# Patient Record
Sex: Male | Born: 1975 | Hispanic: No | Marital: Single
Health system: Southern US, Community
[De-identification: ages and names within clinical notes are randomized; demographics above are authoritative.]

---

## 2001-09-22 ENCOUNTER — Encounter: Payer: Self-pay | Admitting: *Deleted

## 2001-09-22 ENCOUNTER — Emergency Department (HOSPITAL_COMMUNITY): Admission: EM | Admit: 2001-09-22 | Discharge: 2001-09-22 | Payer: Self-pay | Admitting: *Deleted

## 2014-07-14 ENCOUNTER — Encounter (HOSPITAL_COMMUNITY): Payer: Self-pay | Admitting: Emergency Medicine

## 2014-07-14 ENCOUNTER — Emergency Department (HOSPITAL_COMMUNITY)
Admission: EM | Admit: 2014-07-14 | Discharge: 2014-07-14 | Disposition: A | Discharge status: Home / Self Care | Payer: Self-pay | Attending: Emergency Medicine | Admitting: Emergency Medicine

## 2014-07-14 ENCOUNTER — Emergency Department (HOSPITAL_COMMUNITY): Payer: Self-pay

## 2014-07-14 DIAGNOSIS — R079 Chest pain, unspecified: Secondary | ICD-10-CM

## 2014-07-14 DIAGNOSIS — M546 Pain in thoracic spine: Secondary | ICD-10-CM | POA: Insufficient documentation

## 2014-07-14 DIAGNOSIS — R0602 Shortness of breath: Secondary | ICD-10-CM | POA: Insufficient documentation

## 2014-07-14 DIAGNOSIS — R0789 Other chest pain: Secondary | ICD-10-CM | POA: Insufficient documentation

## 2014-07-14 LAB — BASIC METABOLIC PANEL
Anion gap: 13 (ref 5–15)
BUN: 14 mg/dL (ref 6–23)
CALCIUM: 9.1 mg/dL (ref 8.4–10.5)
CO2: 21 mEq/L (ref 19–32)
CREATININE: 0.76 mg/dL (ref 0.50–1.35)
Chloride: 105 mEq/L (ref 96–112)
GFR calc Af Amer: >90 mL/min (ref >90)
GFR calc non Af Amer: >90 mL/min (ref >90)
GLUCOSE: 94 mg/dL (ref 70–99)
Potassium: 3.8 mEq/L (ref 3.7–5.3)
Sodium: 139 mEq/L (ref 137–147)

## 2014-07-14 LAB — CBC
HCT: 43.8 % (ref 39.0–52.0)
Hemoglobin: 15.5 g/dL (ref 13.0–17.0)
MCH: 30.3 pg (ref 26.0–34.0)
MCHC: 35.4 g/dL (ref 30.0–36.0)
MCV: 85.7 fL (ref 78.0–100.0)
Platelets: 141 10*3/uL — ABNORMAL LOW (ref 150–400)
RBC: 5.11 MIL/uL (ref 4.22–5.81)
RDW: 12.5 % (ref 11.5–15.5)
WBC: 5.8 10*3/uL (ref 4.0–10.5)

## 2014-07-14 LAB — I-STAT TROPONIN, ED: Troponin i, poc: 0 ng/mL (ref 0.00–0.08)

## 2014-07-14 MED ORDER — ALBUTEROL SULFATE HFA 108 (90 BASE) MCG/ACT IN AERS
2.0000 | INHALATION_SPRAY | RESPIRATORY_TRACT | Status: AC
  Administered 2014-07-14: 2 via RESPIRATORY_TRACT
  Filled 2014-07-14: qty 6.7

## 2014-07-14 MED ORDER — HYDROCODONE-ACETAMINOPHEN 5-325 MG PO TABS: 2.0000 | ORAL_TABLET | Freq: Once | ORAL | Status: DC

## 2014-07-14 MED ORDER — IBUPROFEN 800 MG PO TABS
800.0000 mg | ORAL_TABLET | Freq: Once | ORAL | Status: AC
  Administered 2014-07-14: 800 mg via ORAL
  Filled 2014-07-14: qty 1

## 2014-07-14 MED ORDER — CYCLOBENZAPRINE HCL 10 MG PO TABS: 10.0000 mg | ORAL_TABLET | Freq: Two times a day (BID) | ORAL | Status: AC | PRN

## 2014-07-14 MED ORDER — ALBUTEROL SULFATE HFA 108 (90 BASE) MCG/ACT IN AERS: 2.0000 | INHALATION_SPRAY | RESPIRATORY_TRACT | Status: AC | PRN

## 2014-07-14 NOTE — ED Notes (Signed)
Per patient (via interpreter): Pain in lungs for 2 weeks, rated 2/10 at worse and right now.  Started off slowly and becoming more constant as time goes on.  Bad enough to come to ED 2 days ago, was seen at other clinic 2 days ago and nothing was done.  Denies cough and fever, no vomiting, no abdominal pain, has had mild headache since 2 weeks ago but only once.   Shortness of breath comes and goes, but pain is constant.  Patient gets sob while working or exerting.  No swelling in legs or long travel recently.   Also has chest pain he states that feels like something is poking.  Rated 2/10, mid left chest, constant, reproducible.  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  Has no previous medical problems except for high cholesterol, does not take any meds on a daily basis and has no allergies.

## 2014-07-14 NOTE — Discharge Instructions (Signed)
Please follow th directions provided.  Be sure to establish care with a primary care provider.  You may use the muscle relaxer medicines for the back pain and the inhaler to help with your shortness of breath.  Don't hesitate to return for new, worsening or concerning symptoms.    SEEK IMMEDIATE MEDICAL CARE IF:  You have pain that radiates from your back into your legs.  You develop new bowel or bladder control problems.  You have unusual weakness or numbness in your arms or legs.  You develop nausea or vomiting.  You develop abdominal pain.  You feel faint. Your shortness of breath gets worse.  You feel light-headed, faint, or develop a cough not controlled with medicines.  You start coughing up blood.  You have pain with breathing.  You have chest pain or pain in your arms, shoulders, or abdomen.  You have a fever.  You are unable to walk up stairs or exercise the way you normally do.   Emergency Department Resource Guide 1) Find a Doctor and Pay Out of Pocket Although you won't have to find out who is covered by your insurance plan, it is a good idea to ask around and get recommendations. You will then need to call the office and see if the doctor you have chosen will accept you as a new patient and what types of options they offer for patients who are self-pay. Some doctors offer discounts or will set up payment plans for their patients who do not have insurance, but you will need to ask so you aren't surprised when you get to your appointment.  2) Contact Your Local Health Department Not all health departments have doctors that can see patients for sick visits, but many do, so it is worth a call to see if yours does. If you don't know where your local health department is, you can check in your phone book. The CDC also has a tool to help you locate your state's health department, and many state websites also have listings of all of their local health departments.  3) Find a Walk-in  Clinic If your illness is not likely to be very severe or complicated, you may want to try a walk in clinic. These are popping up all over the country in pharmacies, drugstores, and shopping centers. They're usually staffed by nurse practitioners or physician assistants that have been trained to treat common illnesses and complaints. They're usually fairly quick and inexpensive. However, if you have serious medical issues or chronic medical problems, these are probably not your best option.  No Primary Care Doctor: - Call Health Connect at  [phone] - they can help you locate a primary care doctor that  accepts your insurance, provides certain services, etc. - Physician Referral Service- [phone]  Chronic Pain Problems: Organization         Address  Phone   Notes  [facility]  [phone] Patients need to be referred by their primary care doctor.   Medication Assistance: Organization         Address  Phone   Notes  [facility] Medication [facility] [address] [place] [place], [place] [address] [phone] --Must be a resident of [facility] -- Must have NO insurance coverage whatsoever (no Medicaid/ Medicare, etc.) -- The pt. MUST have a primary care doctor that directs their care regularly and follows them in the community   [facility]ist  [phone]   [address]  (  [phone]) [phone]    Agencies that provide inexpensive medical care: Organization         Address  Phone   Notes  [facility]  [phone]) [phone]   [facility]    [phone]   [facility] [address] [place], [place] [address] [phone]) [phone]   [facility] [address], [place] [phone]   [facility]    [phone](336) [phone]   [facility]    [phone](336) [phone]   [facility]  [address], [place] Phone:  [phone]) [phone], Fax:  [phone] Hours  of Operation:  9 am - 6 pm, M-F.  Also accepts Medicaid/Medicare and self-pay.  [facility]  [address], [place] Phone: [phone](336) [phone], Fax: [phone]) [phone]. Hours of Operation:  8:30 am - 5:30 pm, M-F.  Also accepts Medicaid and self-pay.  [facility] [place] [address], [place] Phone: [phone](336) [phone]   [facility] [address], [place], [place] [phone], Ext. [phone] Mondays & Thursdays: 7-9 AM.  First 15 patients are seen on a first come, first serve basis.    Medicaid-accepting [facility] Providers:  Organization         Address  Phone   Notes  [facility] [address], [place] [phone]) [phone] Also accepts self-pay patients.  [facility] [address], [place]  [phone]) [phone]   [facility] [address], [place] [phone]   [facility] [address] [place] [address], [place] [phone]   Renaye RakersVeita Bland [address], [place]   [phone] Only accepts [place] Access [place] patients after they have their name applied to their card.   Self-Pay (no insurance) in [facility]:  Organization         Address  Phone   Notes  Sickle Cell Patients, [facility] [address], [place] [phone]) [phone]   [facility] [address], [place] [phone]) [phone]   [facility] [place]  [address], [place] [phone]) [phone]   [facility]/Dr. Osei-Bonsu  [address] [place] [address], [place] or [phone], [place] [phone]) [phone] Phone number for both [place] and [place] locations is the same.  [facility] [address], [place] [phone]   [facility] [place] [address] [place] [address], [place] or [address] [phone]) [phone]   [facility] [address], [place] [phone], phone; [phone]) [phone], fax Sees patients 1st and 3rd Saturday of every month.  Must not qualify for public or private insurance (i.e. Medicaid, Medicare, [place] Health Choice, Veterans' Benefits)  Household income should be no more than 200% of the poverty level The clinic cannot treat you if you are pregnant or think you are pregnant  Sexually transmitted diseases are not treated at the clinic.    Dental Care: Organization         Address  Phone  Notes  [facility] Department of [facility] [address], [place] [phone] Accepts children up to age 38 who are enrolled in [place] or [place] Health Choice; pregnant women with a Medicaid card; and children who  have applied for Medicaid or [place] Health Choice, but were declined, whose parents can pay a reduced fee at time of service.  [facility] [place]  [address], [place] [phone]) [phone] Accepts children up to age 38 who are enrolled in [place]icaid or [place] Health Choice; pregnant women with a Medicaid card; and children who have applied for Medicaid or [place] Health Choice, but were declined, whose parents can pay a reduced fee at time of service.  [facility]  [address], [place] [phone] Patients are seen by appointment only. Walk-ins are not accepted. [facility] will see patients 38 years of age and older. Monday - Tuesday (8am-5pm) Most Wednesdays (8:30-5pm) $30 per visit, cash only  [facility]  [address], [facility] [phone] Patients are seen by appointment only. Walk-ins are not accepted. [facility] will see patients 38 years of age and older. One Wednesday Evening (Monthly: Volunteer Based).  $30 per visit, cash only  [facility]  [phone]) [phone] for adults; Children under age 574, call Graduate  Pediatric Dentistry at [phone]) [phone]. Children aged 524-14, please call [phone]) [phone] to request a pediatric application.  Dental services are provided in all areas of dental care including fillings, crowns and bridges, complete and partial dentures, implants, gum treatment, root canals, and extractions. Preventive care is also provided. Treatment is provided to both adults and children. Patients are selected via a lottery and there is often a waiting list.   [facility] [address], [place]  [phone] [email or web address]   [facility] [address], [place], [place] [phone], Ext. [phone] Second and Fourth Thursday of each month, opens at 6:30 AM; Clinic ends at 9 AM.  Patients are seen on a first-come first-served basis, and a limited number are seen during each clinic.   [facility]  [address], [place], [place] [phone]   Eligibility Requirements You must have lived in [place], [place], or [place] counties for at least the last three months.   You cannot be eligible for state or federal sponsored [place] Cityhealthcare insurance, including CIGNAVeterans Administration, [place]icaid, or [employer] EntertainmentMedicare.   You generally cannot be eligible for healthcare insurance through your employer.    How to apply: Eligibility screenings are held every Tuesday and Wednesday afternoon from 1:00 pm until 4:00 pm. You do not need an appointment for the interview!  [facility] [address], [place], [place] [phone]   [facility]  [phone]   [facility]  [phone]   [facility] Health Department  [phone]    Behavioral Health Resources in the Community: Intensive Outpatient Programs Organization         Address  Phone  Notes  [facility] [address], [place], [place] [phone]   [facility] [address], [place], [place]  [phone]   [facility] [address], [place], [place]  [phone]   [facility] [address],  [place], [place] [phone] or [phone]   Substance Abuse Resources Organization         Address  Phone  Notes  [facility]  [phone]   [facility]  [phone]   [facility]  [phone]   [facility]  [phone]   Residential & Outpatient  Substance Abuse Program  [phone]   Psychological Services Organization         Address  Phone  Notes  [facility]  [phone]   [facility]  [phone]   [facility] [phone] N[address] [place] [phone] or [phone]    Mobile Crisis Teams Organization         Address  Phone  Notes  [facility], Mobile Crisis Care Unit  [phone]   [facility]  [address] [place], [place] [phone]   Doristine Locks [address] [place] [phone]    Self-Help/Support Groups Organization         Address  Phone             Notes  [facility] [place] - variety of support groups  336- I7437963 Call for more information  Narcotics Anonymous (NA), [facility] [address], [employer] [place]  2 meetings at this location   Statistician         Address  Phone  Notes  [facility] [address],    [place]  [phone]   [facility]  [address] [place] [address], [place], [place] [phone]   [facility] [address] [place], [place] [phone] Admissions: 8am-3pm M-F  [facility] [address],    [place], [place] [phone]   [facility] [address] [place], [place], [place] [phone]   [facility] [address]  [place], [place] [phone]   [facility] - Intensive Outpatient [address], [place], [place] [phone]   [facility] ([facility] [address] [place]  [place], [place] [phone] or [phone]   [facility] (RT[facility]) [address] [place], [place] [phone] Accepts Medicaid  [facility] [place] [address],  [place] [phone] Substance Abuse/Addiction Treatment   [facility] Organization         Address  Phone  Notes  [facility]  [phone]   Angie Fava, PhD [address] [place], [place]   [phone] or [phone]   [facility]   [address] [place], [place] [phone]   [facility] [address], [place], [place] [phone] Insurance/Medicaid/sponsorship through [facility] [address]                                    [place], [place] [phone] Therapy/tele-psych/case  [facility] [address], [place] [phone]    Dr. Lolly Mustache  [phone]   [facility] [place]  [employer] [facility] Dept. 1) [address], [place] 2) [address], [place] 3)  [address], [place] [phone]  [phone]   [facility] Child Abuse Hotline [phone]) [phone] or [phone] (After Hours)

## 2014-07-14 NOTE — ED Provider Notes (Signed)
CSN: [id number]     Arrival date & time 07/14/14  1217 History   First MD Initiated Contact with Patient 07/14/14 1228     Chief Complaint  Patient presents with  . Back Pain   (Consider location/radiation/quality/duration/timing/severity/associated sxs/prior Treatment) The history is limited by a language barrier. A language interpreter was used.   Ryan Ramsey is a 38 yo male presenting with pain between his shoulder blades and shortness of breath x 2 weeks.  He reports the pain started as mild pain and slowly progressed untill 2 days ago when it got much worse.  He reports constant bilat back pain but the shortness of breath is intermittent and worse when he is working around dust. He states a friend of his had similar symptoms and had gone to several clinics before coming to the ED and once in the ED he had a cxr that showed he had lung cancer.  Pt is concerned his symptoms may be indicative of a similar diagnosis. He denies fever, hemoptysis, worsening chest pain or shortness of breath on exertion.  History reviewed. No pertinent past medical history. No past surgical history on file. No family history on file. History  Substance Use Topics  . Smoking status: Not on file  . Smokeless tobacco: Not on file  . Alcohol Use: Not on file    Review of Systems  Constitutional: Negative for fever and chills.  HENT: Negative for sore throat.   Eyes: Negative for visual disturbance.  Respiratory: Positive for chest tightness and shortness of breath. Negative for cough.   Cardiovascular: Negative for chest pain and leg swelling.  Gastrointestinal: Negative for nausea, vomiting and diarrhea.  Genitourinary: Negative for dysuria.  Musculoskeletal: Positive for back pain. Negative for myalgias.  Skin: Negative for rash.  Neurological: Negative for weakness, numbness and headaches.    Allergies  Review of patient's allergies indicates no known allergies.  Home Medications   Prior to  Admission medications   Not on File   BP 138/93  Pulse 65  Temp(Src) 98.9 F (37.2 C) (Oral)  Resp 20  SpO2 100% Physical Exam  Nursing note and vitals reviewed. Constitutional: He appears well-developed and well-nourished. No distress.  HENT:  Head: Normocephalic and atraumatic.  Mouth/Throat: Oropharynx is clear and moist. No oropharyngeal exudate.  Eyes: Conjunctivae are normal.  Neck: Neck supple. No thyromegaly present.  Cardiovascular: Normal rate, regular rhythm and intact distal pulses.   Pulmonary/Chest: Effort normal. No respiratory distress. He has decreased breath sounds in the right lower field and the left lower field. He has no wheezes. He has no rhonchi. He has no rales. He exhibits no tenderness.  Abdominal: Soft. There is no tenderness.  Musculoskeletal: He exhibits no tenderness.       Back:  Lymphadenopathy:    He has no cervical adenopathy.  Neurological: He is alert.  Skin: Skin is warm and dry. No rash noted. He is not diaphoretic.  Psychiatric: He has a normal mood and affect.    ED Course  Procedures (including critical care time) Labs Review Labs Reviewed  CBC - Abnormal; Notable for the following:    Platelets 141 (*)    All other components within normal limits  BASIC METABOLIC PANEL  I-STAT TROPOININ, ED    Imaging Review Dg Chest 2 View  07/14/2014   CLINICAL DATA:  Chest pain for 2 weeks. Works in a factory with lots of best.  EXAM: CHEST  2 VIEW  COMPARISON:  None.  FINDINGS:  The heart size and mediastinal contours are within normal limits. Both lungs are clear. The visualized skeletal structures are unremarkable.  IMPRESSION: No active cardiopulmonary disease.   Electronically Signed   By: Britta MccreedySusan  Turner M.D.   On: 07/14/2014 14:13     EKG Interpretation   Date/Time:  Saturday July 14 2014 12:22:47 EDT Ventricular Rate:  62 PR Interval:  148 QRS Duration: 116 QT Interval:  424 QTC Calculation: 430 R Axis:   83 Text  Interpretation:  Normal sinus rhythm Incomplete right bundle branch  block Borderline ECG No previous tracing Confirmed by KNAPP  MD-J, JON  ([id number]) on 07/14/2014 12:35:36 PM      MDM   Final diagnoses:  Bilateral thoracic back pain   38 yo male with thoracic back pain and shortness of breath worse while exposed to dust at work.  His back pain appears muscular in etiology as he reports his job requires him to stoop frequently and is reproducible with palpation.  His x-ray shows no acute cardiopulmonary abnormality.  His labs and EKG are unremarkable. He is alert, vital signs stable and in no acute distress.  He appears safe to be discharged home.  Discharge instructions include use of muscle relaxant and albuterol MDI and resources to establish care with a primary care provider. Pt aware of plan and in agreement.  Return precautions provided.       Filed Vitals:   07/14/14 1333 07/14/14 1400 07/14/14 1405 07/14/14 1430  BP: 116/64 130/75 130/75 119/75  Pulse: 50 54 58 56  Temp:      TempSrc:      Resp:   16   SpO2: 100% 98% 98% 98%   Meds given in ED:  Medications  ibuprofen (ADVIL,MOTRIN) tablet 800 mg (800 mg Oral Given 07/14/14 1343)  albuterol (PROVENTIL HFA;VENTOLIN HFA) 108 (90 BASE) MCG/ACT inhaler 2 puff (2 puffs Inhalation Given 07/14/14 1443)    New Prescriptions   ALBUTEROL (PROVENTIL HFA;VENTOLIN HFA) 108 (90 BASE) MCG/ACT INHALER    Inhale 2 puffs into the lungs every 4 (four) hours as needed for wheezing or shortness of breath.   CYCLOBENZAPRINE (FLEXERIL) 10 MG TABLET    Take 1 tablet (10 mg total) by mouth 2 (two) times daily as needed for muscle spasms.     Harle BattiestElizabeth Ellene Bloodsaw, NP 07/17/14 2249

## 2014-07-14 NOTE — ED Notes (Signed)
Pt in c/o back pain between his shoulder blades, states this has been going on for two weeks but is progressively getting worse, states he feels like he can't breathe at times, denies chest pain, no distress noted

## 2014-07-18 NOTE — ED Provider Notes (Signed)
Medical screening examination/treatment/procedure(s) were performed by non-physician practitioner and as supervising physician I was immediately available for consultation/collaboration.   EKG Interpretation   Date/Time:  Saturday July 14 2014 12:22:47 EDT Ventricular Rate:  62 PR Interval:  148 QRS Duration: 116 QT Interval:  424 QTC Calculation: 430 R Axis:   83 Text Interpretation:  Normal sinus rhythm Incomplete right bundle branch  block Borderline ECG No previous tracing Confirmed by Tiney Zipper  MD-J, Demica Zook  ([id number]) on 07/14/2014 12:35:36 PM      Linwood DibblesJon Salvatore Shear, MD 07/18/14 [phone]

## 2015-08-14 IMAGING — CR DG CHEST 2V
2 series · 2 of 2 positions shown · non-contrast
Comparison: None.

CLINICAL DATA: Chest pain for 2 weeks. Works in a factory with lots
of [employer].

EXAM:
CHEST  2 VIEW

[w chest pa]
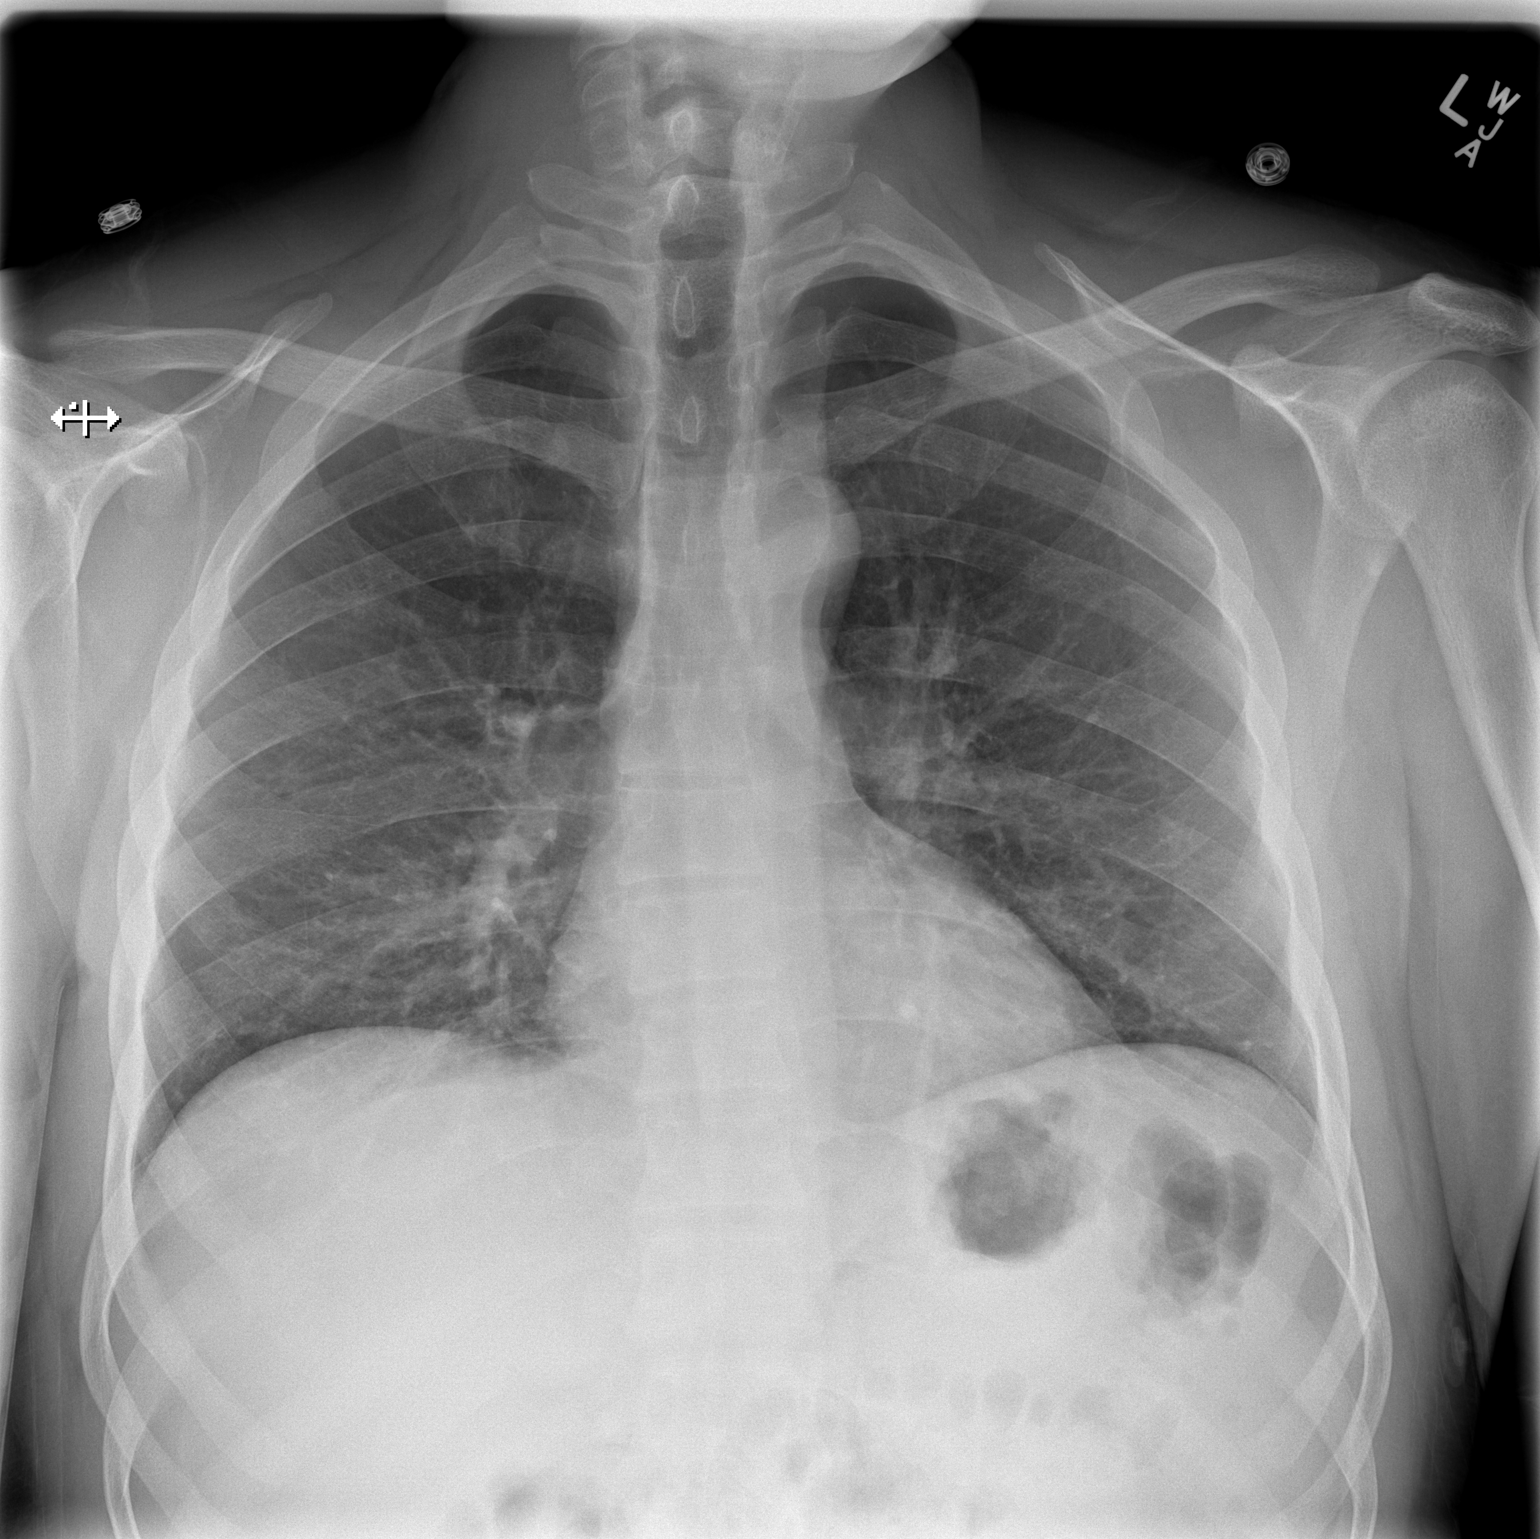

[w chest lat]
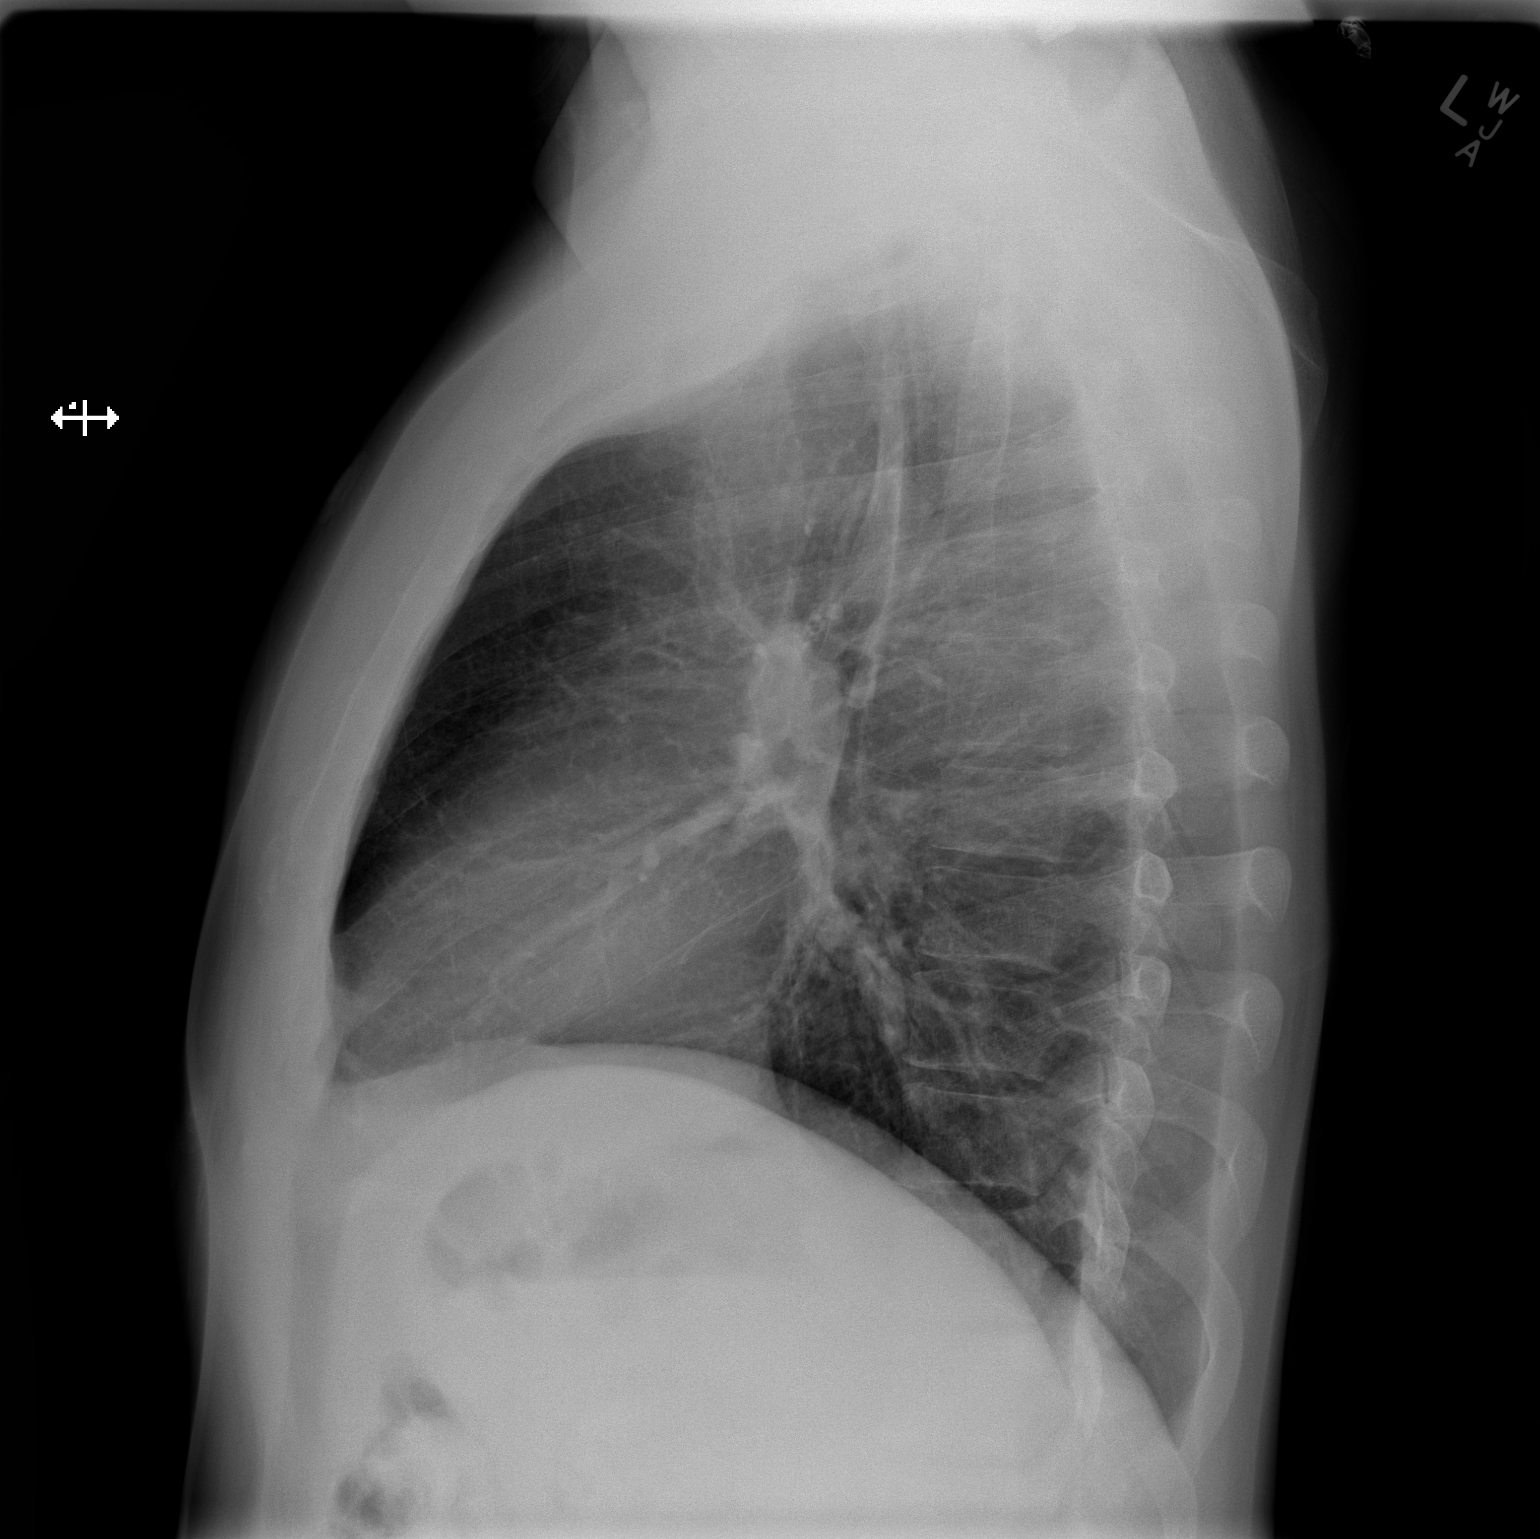

[2 of 2 positions shown; findings below may reference images not displayed]

FINDINGS: The heart size and mediastinal contours are within normal limits.
Both lungs are clear. The visualized skeletal structures are
unremarkable.
IMPRESSION: No active cardiopulmonary disease.
